# Patient Record
Sex: Male | Born: 1960 | Race: Black or African American | Hispanic: No | Marital: Single | Smoking: Current every day smoker
Health system: Southern US, Community
[De-identification: ages and names within clinical notes are randomized; demographics above are authoritative.]

## PROBLEM LIST (undated history)

## (undated) HISTORY — PX: HERNIA REPAIR: SHX51

## (undated) HISTORY — PX: HAND SURGERY: SHX662

---

## 2009-12-15 ENCOUNTER — Emergency Department (HOSPITAL_COMMUNITY): Admission: EM | Admit: 2009-12-15 | Discharge: 2009-12-15 | Payer: Self-pay | Admitting: Emergency Medicine

## 2015-05-24 ENCOUNTER — Emergency Department (HOSPITAL_COMMUNITY)
Admission: EM | Admit: 2015-05-24 | Discharge: 2015-05-24 | Disposition: A | Discharge status: Home / Self Care | Payer: Self-pay | Attending: Emergency Medicine | Admitting: Emergency Medicine

## 2015-05-24 ENCOUNTER — Encounter (HOSPITAL_COMMUNITY): Payer: Self-pay | Admitting: *Deleted

## 2015-05-24 DIAGNOSIS — K047 Periapical abscess without sinus: Secondary | ICD-10-CM

## 2015-05-24 DIAGNOSIS — Z720 Tobacco use: Secondary | ICD-10-CM | POA: Insufficient documentation

## 2015-05-24 DIAGNOSIS — K029 Dental caries, unspecified: Secondary | ICD-10-CM | POA: Insufficient documentation

## 2015-05-24 MED ORDER — PENICILLIN V POTASSIUM 500 MG PO TABS
500.0000 mg | ORAL_TABLET | Freq: Once | ORAL | Status: AC
  Administered 2015-05-24: 500 mg via ORAL
  Filled 2015-05-24: qty 1

## 2015-05-24 MED ORDER — NAPROXEN 500 MG PO TABS
500.0000 mg | ORAL_TABLET | Freq: Once | ORAL | Status: AC
  Administered 2015-05-24: 500 mg via ORAL
  Filled 2015-05-24: qty 1

## 2015-05-24 MED ORDER — NAPROXEN 250 MG PO TABS: 250.0000 mg | ORAL_TABLET | Freq: Two times a day (BID) | ORAL | Status: DC

## 2015-05-24 MED ORDER — PENICILLIN V POTASSIUM 500 MG PO TABS: 500.0000 mg | ORAL_TABLET | Freq: Four times a day (QID) | ORAL | Status: DC

## 2015-05-24 NOTE — Discharge Instructions (Signed)
Dental Abscess °A dental abscess is a collection of infected fluid (pus) from a bacterial infection in the inner part of the tooth (pulp). It usually occurs at the end of the tooth's root.  °CAUSES  °· Severe tooth decay. °· Trauma to the tooth that allows bacteria to enter into the pulp, such as a broken or chipped tooth. °SYMPTOMS  °· Severe pain in and around the infected tooth. °· Swelling and redness around the abscessed tooth or in the mouth or face. °· Tenderness. °· Pus drainage. °· Bad breath. °· Bitter taste in the mouth. °· Difficulty swallowing. °· Difficulty opening the mouth. °· Nausea. °· Vomiting. °· Chills. °· Swollen neck glands. °DIAGNOSIS  °· A medical and dental history will be taken. °· An examination will be performed by tapping on the abscessed tooth. °· X-rays may be taken of the tooth to identify the abscess. °TREATMENT °The goal of treatment is to eliminate the infection. You may be prescribed antibiotic medicine to stop the infection from spreading. A root canal may be performed to save the tooth. If the tooth cannot be saved, it may be pulled (extracted) and the abscess may be drained.  °HOME CARE INSTRUCTIONS °· Only take over-the-counter or prescription medicines for pain, fever, or discomfort as directed by your caregiver. °· Rinse your mouth (gargle) often with salt water (¼ tsp salt in 8 oz [250 ml] of warm water) to relieve pain or swelling. °· Do not drive after taking pain medicine (narcotics). °· Do not apply heat to the outside of your face. °· Return to your dentist for further treatment as directed. °SEEK MEDICAL CARE IF: °· Your pain is not helped by medicine. °· Your pain is getting worse instead of better. °SEEK IMMEDIATE MEDICAL CARE IF: °· You have a fever or persistent symptoms for more than 2-3 days. °· You have a fever and your symptoms suddenly get worse. °· You have chills or a very bad headache. °· You have problems breathing or swallowing. °· You have trouble  opening your mouth. °· You have swelling in the neck or around the eye. °Document Released: 10/14/2005 Document Revised: 07/08/2012 Document Reviewed: 01/22/2011 °ExitCare® Patient Information ©2015 [employer]. This information is not intended to replace advice given to you by your health care provider. Make sure you discuss any questions you have with your health care provider. ° ° °Emergency Department Resource Guide °1) Find a Doctor and Pay Out of Pocket °Although you won't have to find out who is covered by your insurance plan, it is a good idea to ask around and get recommendations. You will then need to call the office and see if the doctor you have chosen will accept you as a new patient and what types of options they offer for patients who are self-pay. Some doctors offer discounts or will set up payment plans for their patients who do not have insurance, but you will need to ask so you aren't surprised when you get to your appointment. ° °2) Contact Your Local Health Department °Not all health departments have doctors that can see patients for sick visits, but many do, so it is worth a call to see if yours does. If you don't know where your local health department is, you can check in your phone book. The CDC also has a tool to help you locate your state's health department, and many state websites also have listings of all of their local health departments. ° °3) Find a Walk-in   Clinic °If your illness is not likely to be very severe or complicated, you may want to try a walk in clinic. These are popping up all over the country in pharmacies, drugstores, and shopping centers. They're usually staffed by nurse practitioners or physician assistants that have been trained to treat common illnesses and complaints. They're usually fairly quick and inexpensive. However, if you have serious medical issues or chronic medical problems, these are probably not your best option. ° °No Primary Care Doctor: °- Call  [facility] at  832-8000 - they can help you locate a primary care doctor that  accepts your insurance, provides certain services, etc. °- Physician Referral Service- 1-800-533-3463 ° °Chronic Pain Problems: °Organization         Address  Phone   Notes  °[facility]  (336) 297-2271 Patients need to be referred by their primary care doctor.  ° °Medication Assistance: °Organization         Address  Phone   Notes  °[place] [facility] [address] °[place], [place] [address] (336) 641-8030 --Must be a resident of [place] °-- Must have NO insurance coverage whatsoever (no Medicaid/ Medicare, etc.) °-- The pt. MUST have a primary care doctor that directs their care regularly and follows them in the community °  °[facility]  (866) 331-1348   °[employer]  (888) 892-1162   ° °Agencies that provide inexpensive medical care: °Organization         Address  Phone   Notes  °[facility]  (336) 832-8035   °[facility]    (336) 832-7272   °[facility] [address] °[place], [place] [address] (336) 832-4777   °[facility] [address], °[place] (336) 271-4999   °[facility]    (336) 373-0678   °[facility]    (336) 272-1050   °[facility] ° [address], [place] Phone:  (336) 832-4444, Fax:  (336) 832-4440 Hours of Operation:  9 am - 6 pm, M-F.  Also accepts Medicaid/Medicare and self-pay.  °[facility] ° [address], [place] Phone: (336) 832-3150, Fax: (336) 832-3151. Hours of Operation:  8:30 am - 5:30 pm, M-F.  Also accepts Medicaid and self-pay.  °[facility] [place] [address], [place] Phone: (336) 878-6027   °[facility] [address], [place], [place] (336)723-1848, Ext. 123 Mondays & Thursdays: 7-9 AM.  First 15 patients are seen on a first come, first serve  basis. °  ° °Medicaid-accepting [place] Providers: ° °Organization         Address  Phone   Notes  °[facility] [address], [place] (336) 641-2100 Also accepts self-pay patients.  °[facility] [address], [place] ° (336) 856-9996   °[facility] [address], [place] (336) 288-8857   °[facility] [address] [place] [address], [place] (336) 299-7000   °Veita Bland [address], [place]  ° (336) 373-1557 Only accepts [place] Access Medicaid patients after they have their name applied to their card.  ° °Self-Pay (no insurance) in [place]: ° °Organization         Address  Phone   Notes  °Sickle Cell Patients, [facility] [address], [place] (  336) 832-1970   °[facility] [address], [place] (336) 832-4400   ° [facility] ° [address], [place] (336) 992-4800   °[facility]/Dr. Osei-Bonsu ° [address] [place] [address], [place] or [address], [place] (336) 841-8500 Phone number for both [place] and [place] locations is the same.  °[facility] [address], [place] (336) 299-0000   °[facility] [address] [place] [address], [place] or [address] (336) 852-7530 °(336) 878-2260   °[facility] [address], [place] (336) 350-1642, phone; (336) 294-5005, fax Sees patients 1st and 3rd Saturday of every month.  Must not qualify for public or private insurance (i.e. Medicaid, Medicare, [place] Health Choice, Veterans' Benefits) • Household income should be no more than 200% of the poverty level •The clinic cannot treat you if you are pregnant or think you are pregnant • Sexually transmitted diseases are not treated at the clinic.  ° ° °Dental Care: °Organization         Address  Phone  Notes  °[place] [address], [place] (336) 641-6152 Accepts children up to age 21 who are enrolled in Medicaid or [place] Health Choice; pregnant women with a Medicaid card; and children who have applied for Medicaid or [place] Health Choice, but were declined, whose parents can pay a reduced fee at time of service.  °[facility] [place]  [address], [place] (336) 641-7733 Accepts children up to age 21 who are enrolled in Medicaid or [place] Health Choice; pregnant women with a Medicaid card; and children who have applied for Medicaid or [place] Health Choice, but were declined, whose parents can pay a reduced fee at time of service.  °[facility] ° [address], [place] (336) 641-4533 Patients are seen by appointment only. Walk-ins are not accepted. [facility] will see patients 18 years of age and older. °Monday - Tuesday (8am-5pm) °Most Wednesdays (8:30-5pm) °$30 per visit, cash only  °[facility] ° [address], [place] (336) 641-4533 Patients are seen by appointment only. Walk-ins are not accepted. [facility] will see patients 18 years of age and older. °One Wednesday Evening (Monthly: Volunteer Based).  $30 per visit, cash only  °[facility]  (919) 537-3737 for adults; Children under age 4, call Graduate Pediatric Dentistry at ([phone]) [phone]. Children aged 4-14, please call (919) [phone] to request a pediatric application. ° Dental services are provided in all areas of dental care including fillings, crowns and bridges, complete and partial dentures, implants, gum treatment, root canals, and extractions. Preventive care is also provided. Treatment is provided to both adults and children. °Patients are selected via a lottery and there is often a waiting list. °  °[facility] [address], °[place] ° (336) 763-8833  [email or web address] °  °[facility] [address], [place], [place] (336)723-1848, Ext. 123 Second and Fourth Thursday of each month, opens at 6:30 AM; Clinic ends at 9 AM.  Patients are seen on a first-come first-served basis, and a limited number are seen during each clinic.  ° °[facility] ° [address], [place], [place] (336) 723-7904   Eligibility Requirements °You must have lived in [place], [place], or [place] counties   for at least the last three months. °  You cannot be eligible for state or federal sponsored healthcare insurance, including Veterans Administration, Medicaid, or Medicare. °  You generally cannot be eligible for healthcare insurance through your employer.  °  How to apply: °Eligibility screenings are held every Tuesday and Wednesday afternoon from 1:00 pm until 4:00 pm. You do not need an appointment for the interview!  °[facility] [address], [place], [place] 336-631-2330   °[facility]  336-342-8273   °[facility]  336-703-3100   °[facility]  336-570-6415   ° °Behavioral Health Resources in the Community: °Intensive Outpatient Programs °Organization         Address  Phone  Notes  °[facility] [address], [place], [place] 336-878-6098   °[facility] [address], [place], [place] 336-832-9800   °[facility] [address], [place], [place] ° 336-882-2125   °[facility] [address],  °[place], [place] 1-800-853-5163 or 336-641-4981   °Substance Abuse Resources °Organization         Address  Phone  Notes  °[facility]  336-882-2125   °[facility]  336-784-9470   °[facility]  336-285-9073   °[facility]  336-845-3988   °Residential & Outpatient Substance Abuse Program  1-800-659-3381   °Psychological Services °Organization          Address  Phone  Notes  °[facility]  336- 832-9600   °[facility]  336- 378-7881   °[facility] [address], [place] 1-800-853-5163 or 336-641-4981   ° °Mobile Crisis Teams °Organization         Address  Phone  Notes  °[facility], Mobile Crisis Care Unit  1-877-626-1772   °Assertive °[facility] ° [address] [place], [place] 336-834-9664   °Sharon DeEsch [address] °[place] 336-554-5454   ° °Self-Help/Support Groups °Organization         Address  Phone             Notes  °[facility] - variety of support groups  336- 373-1402 Call for more information  °Narcotics Anonymous (NA), [facility] [address], °[place]  2 meetings at this location  ° °Residential Treatment Programs °Organization         Address  Phone  Notes  °[facility] [address],    °[place]  1-866-801-8205   °[facility] ° [address], [place], [place] 704-293-8524   °[facility] [address], [place] 336-845-3988 Admissions: 8am-3pm M-F  °[facility] [address]    °[place], [place] 336-841-1104   °[facility] [address], [place], [place] 336-379-7146   °[facility] [address]  °[place], [place] 336-285-9073   °[facility] Intensive Outpatient [address], [place], [place] 336-852-3033   °[facility] [address]  °[place], [place] 1-877-615-2722 or 336-784-9470   °[facility] [address] [place], [place] 336-227-7417 Accepts Medicaid  °[facility] [address]  °  1-800-659-3381 Substance Abuse/Addiction Treatment  ° °[place] Behavioral Health Resources °Organization         Address  Phone  Notes  °[facility]  (888)   581-9988   °Julie Brannon, PhD [address] [place], [place]   (336) 349-5553 or (336) 951-0000   °[facility]   [address] °[place], [place] (336) 349-4454   °[facility] [address], [place], [place] (336) 342-8316 Insurance/Medicaid/sponsorship through [facility]  °[facility] [address]                                    [place], [place] (336) 342-8316 Therapy/tele-psych/case  °[facility] [address]  ° [place], [place] (336) 349-2233    °Dr. Arfeen  (336) 349-4544   °[facility] [place]  [employer] [place] [facility] 1) [address], [place] °2) [address], [place] °3)  [address], [place] (336) 349-3220 °(336) 342-7768 ° °(336) 342-8140   °[place] [facility] (336) 342-1394 or (336) 342-3537 (After Hours)    ° ° ° °

## 2015-05-24 NOTE — ED Provider Notes (Signed)
History  This chart was scribed for non-physician practitioner, Everlene Farrier, PA-C,working with Arby Barrette, MD, by Karle Plumber, ED Scribe. This patient was seen in room WTR8/WTR8 and the patient's care was started at 12:11 PM.  Chief Complaint  Patient presents with  . Dental Pain   The history is provided by the patient and medical records. No language interpreter was used.    HPI Comments:  Jeffrey Sheppard is a 54 y.o. male who presents to the Emergency Department complaining of severe lower right-sided dental pain that began three days ago. He states he believes he has an abscess because he has had them before in the same area. He reports associated right-sided facial swelling. He rates the pain as 10/10. Pt reports he has not taken anything for pain. He reports he has been eating and drinking normally. He denies any modifying factors. He denies fever, chills, nausea, vomiting, difficulty swallowing, drooling, inability to swallow, ear pain, neck pain or drainage from the area. He denies h/o IV drug use. He has not seen and does not have a dentist.   History reviewed. No pertinent past medical history. History reviewed. No pertinent past surgical history. No family history on file. History  Substance Use Topics  . Smoking status: Current Every Day Smoker  . Smokeless tobacco: Not on file  . Alcohol Use: No    Review of Systems  Constitutional: Negative for fever and chills.  HENT: Positive for dental problem and facial swelling. Negative for drooling, ear pain and trouble swallowing.   Gastrointestinal: Negative for nausea, vomiting and abdominal pain.  Musculoskeletal: Negative for neck pain and neck stiffness.  Skin: Negative for rash.  Neurological: Negative for light-headedness and headaches.    Allergies  Review of patient's allergies indicates not on file.  Home Medications   Prior to Admission medications   Medication Sig Start Date End Date Taking?  Authorizing Provider  naproxen (NAPROSYN) 250 MG tablet Take 1 tablet (250 mg total) by mouth 2 (two) times daily with a meal. 05/24/15   Everlene Farrier, PA-C  penicillin v potassium (VEETID) 500 MG tablet Take 1 tablet (500 mg total) by mouth 4 (four) times daily. 05/24/15   Everlene Farrier, PA-C   Triage Vitals: BP 153/84 mmHg  Pulse 69  Temp(Src) 97.7 F (36.5 C) (Oral)  Resp 18  SpO2 100% Physical Exam  Constitutional: He appears well-developed and well-nourished. No distress.  Non-toxic appearing.   HENT:  Head: Normocephalic and atraumatic.  Right Ear: External ear normal.  Left Ear: External ear normal.  Mouth/Throat: Oropharynx is clear and moist. Abnormal dentition. Dental abscesses and dental caries present. No oropharyngeal exudate.  Tenderness to right lower jaw. No discharge from the mouth. Right-sided facial swelling.  Uvula is midline without edema. Soft palate rises symmetrically. No tonsillar hypertrophy or exudates. Tongue protrusion is normal.  Area of induration to right lower jaw. Generally poor dentition. No area of fluctuance. Partially edentulous  Eyes: Conjunctivae are normal. Pupils are equal, round, and reactive to light. Right eye exhibits no discharge. Left eye exhibits no discharge.  Neck: Normal range of motion. Neck supple. No JVD present. No tracheal deviation present.  Cardiovascular: Normal rate and intact distal pulses.   Pulmonary/Chest: Effort normal. No respiratory distress.  Lymphadenopathy:    He has no cervical adenopathy.  Neurological: Coordination normal.  Skin: No rash noted. He is not diaphoretic.  Psychiatric: He has a normal mood and affect. His behavior is normal.  Nursing note and  vitals reviewed.   ED Course  Procedures (including critical care time) DIAGNOSTIC STUDIES: Oxygen Saturation is 100% on RA, normal by my interpretation.   COORDINATION OF CARE: 12:16 PM- Will prescribe antibiotics and refer to dentist. Spoke with Dr.  Donnald Garre about patient. Pt verbalizes understanding and agrees to plan.  Medications  penicillin v potassium (VEETID) tablet 500 mg (500 mg Oral Given 05/24/15 1226)  naproxen (NAPROSYN) tablet 500 mg (500 mg Oral Given 05/24/15 1226)    Labs Review Labs Reviewed - No data to display  Imaging Review No results found.   EKG Interpretation None      Filed Vitals:   05/24/15 1136  BP: 153/84  Pulse: 69  Temp: 97.7 F (36.5 C)  TempSrc: Oral  Resp: 18  SpO2: 100%     MDM   Meds given in ED:  Medications  penicillin v potassium (VEETID) tablet 500 mg (500 mg Oral Given 05/24/15 1226)  naproxen (NAPROSYN) tablet 500 mg (500 mg Oral Given 05/24/15 1226)    Discharge Medication List as of 05/24/2015 12:23 PM    START taking these medications   Details  naproxen (NAPROSYN) 250 MG tablet Take 1 tablet (250 mg total) by mouth 2 (two) times daily with a meal., Starting 05/24/2015, Until Discontinued, Print    penicillin v potassium (VEETID) 500 MG tablet Take 1 tablet (500 mg total) by mouth 4 (four) times daily., Starting 05/24/2015, Until Discontinued, Print        Final diagnoses:  Dental abscess   This is a 54 y.o. male who presents to the Emergency Department complaining of severe lower right-sided dental pain that began three days ago. He states he believes he has an abscess because he has had them before in the same area. He reports associated right-sided facial swelling. On exam patient is afebrile and nontoxic appearing. The patient has area of induration to his right lower jaw. There is no area of fluctuance appreciated. There seems to be no abscessed drained with an induration. Patient has generally poor dentition.No discharge from his mouth. Tongue protrusion is normal.Patient reports he has been able to eat inject normally. Exam unconcerning for Ludwig's angina or spread of infection. We'll treat patient with penicillin and naproxen and have a follow-up with his dentist.  Patient tolerated his first dose of penicillin and naproxen in the ED without difficulty. I advised the patient to follow-up with their primary care provider this week. I advised the patient to return to the emergency department with new or worsening symptoms or new concerns. The patient verbalized understanding and agreement with plan.    This patient was discussed with Dr. Donnald Garre who agrees with assessment and plan.   I personally performed the services described in this documentation, which was scribed in my presence. The recorded information has been reviewed and is accurate.    Everlene Farrier, PA-C 05/24/15 1246  Arby Barrette, MD 06/05/15 [phone]

## 2015-05-24 NOTE — ED Notes (Signed)
Pt complains of pain and swelling for the past 3 days in his right in his lower jaw. Pt states he has had an abscess in this area before, which went away with antibiotics.

## 2019-06-09 ENCOUNTER — Other Ambulatory Visit: Payer: Self-pay

## 2019-06-09 ENCOUNTER — Ambulatory Visit (INDEPENDENT_AMBULATORY_CARE_PROVIDER_SITE_OTHER): Payer: BLUE CROSS/BLUE SHIELD

## 2019-06-09 ENCOUNTER — Ambulatory Visit (HOSPITAL_COMMUNITY)
Admission: EM | Admit: 2019-06-09 | Discharge: 2019-06-09 | Disposition: A | Discharge status: Home / Self Care | Payer: BLUE CROSS/BLUE SHIELD | Attending: Urgent Care | Admitting: Urgent Care

## 2019-06-09 ENCOUNTER — Encounter (HOSPITAL_COMMUNITY): Payer: Self-pay | Admitting: Emergency Medicine

## 2019-06-09 DIAGNOSIS — M25512 Pain in left shoulder: Secondary | ICD-10-CM

## 2019-06-09 DIAGNOSIS — W2209XA Striking against other stationary object, initial encounter: Secondary | ICD-10-CM

## 2019-06-09 DIAGNOSIS — M25522 Pain in left elbow: Secondary | ICD-10-CM

## 2019-06-09 DIAGNOSIS — S59902A Unspecified injury of left elbow, initial encounter: Secondary | ICD-10-CM

## 2019-06-09 MED ORDER — PREDNISONE 20 MG PO TABS: ORAL_TABLET | ORAL | 0 refills | Status: DC

## 2019-06-09 NOTE — ED Triage Notes (Signed)
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  Patient was tugging at a bolt, when it released, left elbow forcibly connected with metal piece.  This occurred 4 months ago.  Now elbow hurts and pain has radiated to shoulder and across top of shoulder

## 2019-06-09 NOTE — ED Provider Notes (Signed)
  MRN: [id number] DOB: 11-Jul-1961  Subjective:   Jeffrey Sheppard is a 58 y.o. male presenting for 3 to 71-month history of persistent left elbow pain with associated swelling.  Patient states that he was working to try and removable top of a car when he lost grip and jammed his elbow into a metal plate behind him.  He has since had daily intermittent sharp pains worse with pressure or full flexion.  He is now having the pain radiating into his left upper arm and shoulder. Smokes 1ppd.   Denies taking chronic medications, has no known food or drug allergies.  Denies past medical and past surgical history.  ROS Denies fever, redness, warmth, bruising, loss of sensation and range of motion for his elbow and shoulder.  Objective:   Vitals: BP 125/86 (BP Location: Right Arm)   Pulse 75   Temp 97.8 F (36.6 C) (Oral)   Resp 20   SpO2 96%   Physical Exam Constitutional:      Appearance: Normal appearance. He is well-developed and normal weight.  HENT:     Head: Normocephalic and atraumatic.     Right Ear: External ear normal.     Left Ear: External ear normal.     Nose: Nose normal.     Mouth/Throat:     Pharynx: Oropharynx is clear.  Eyes:     Extraocular Movements: Extraocular movements intact.     Pupils: Pupils are equal, round, and reactive to light.  Cardiovascular:     Rate and Rhythm: Normal rate.  Pulmonary:     Effort: Pulmonary effort is normal.  Musculoskeletal:     Left elbow: He exhibits swelling (Trace over olecranon process). He exhibits normal range of motion, no deformity and no laceration. Tenderness found. Olecranon process (With associated slight crepitus) tenderness noted.  Neurological:     Mental Status: He is alert and oriented to person, place, and time.  Psychiatric:        Mood and Affect: Mood normal.        Behavior: Behavior normal.    Dg Elbow Complete Left  Result Date: 06/09/2019 CLINICAL DATA:  Left elbow pain x 2 months due to wrench slipping  and hit his elbow on iron bar. Hurts to put pressure on it and and cold weather. No previous injury or fx. Per provider: Left elbow injury, crepitusleft elbow injury, crepitus EXAM: LEFT ELBOW - COMPLETE 3+ VIEW COMPARISON:  None. FINDINGS: There is no evidence of fracture, dislocation, or joint effusion. There is no evidence of arthropathy or other focal bone abnormality. Soft tissues are unremarkable. IMPRESSION: No fracture or dislocation. Electronically Signed   By: Suzy Bouchard M.D.   On: 06/09/2019 18:14    Assessment and Plan :   1. Elbow injury, left, initial encounter   2. Left elbow pain   3. Acute pain of left shoulder     There appears to be a loose body on x-ray over lateral side of his left elbow seen on lateral and AP views.  Overread is negative.  Will use prednisone course given that patient has tried arthritis medication to help with his pain and has not gotten relief.  Recommended patient rest from work.  Note provided. Counseled patient on potential for adverse effects with medications prescribed/recommended today, ER and return-to-clinic precautions discussed, patient verbalized understanding.     Jaynee Eagles, [place] 06/09/19 1824

## 2020-04-06 IMAGING — DX LEFT ELBOW - COMPLETE 3+ VIEW
4 series · 4 of 4 positions shown · non-contrast
Comparison: None.

CLINICAL DATA: Left elbow pain x 2 months due to wrench slipping
and hit his elbow on iron bar. Hurts to put pressure on it and and
cold weather. No previous injury or fx. Per provider: Left elbow
injury, crepitusleft elbow injury, crepitus

EXAM:
LEFT ELBOW - COMPLETE 3+ VIEW

[elbow ap]
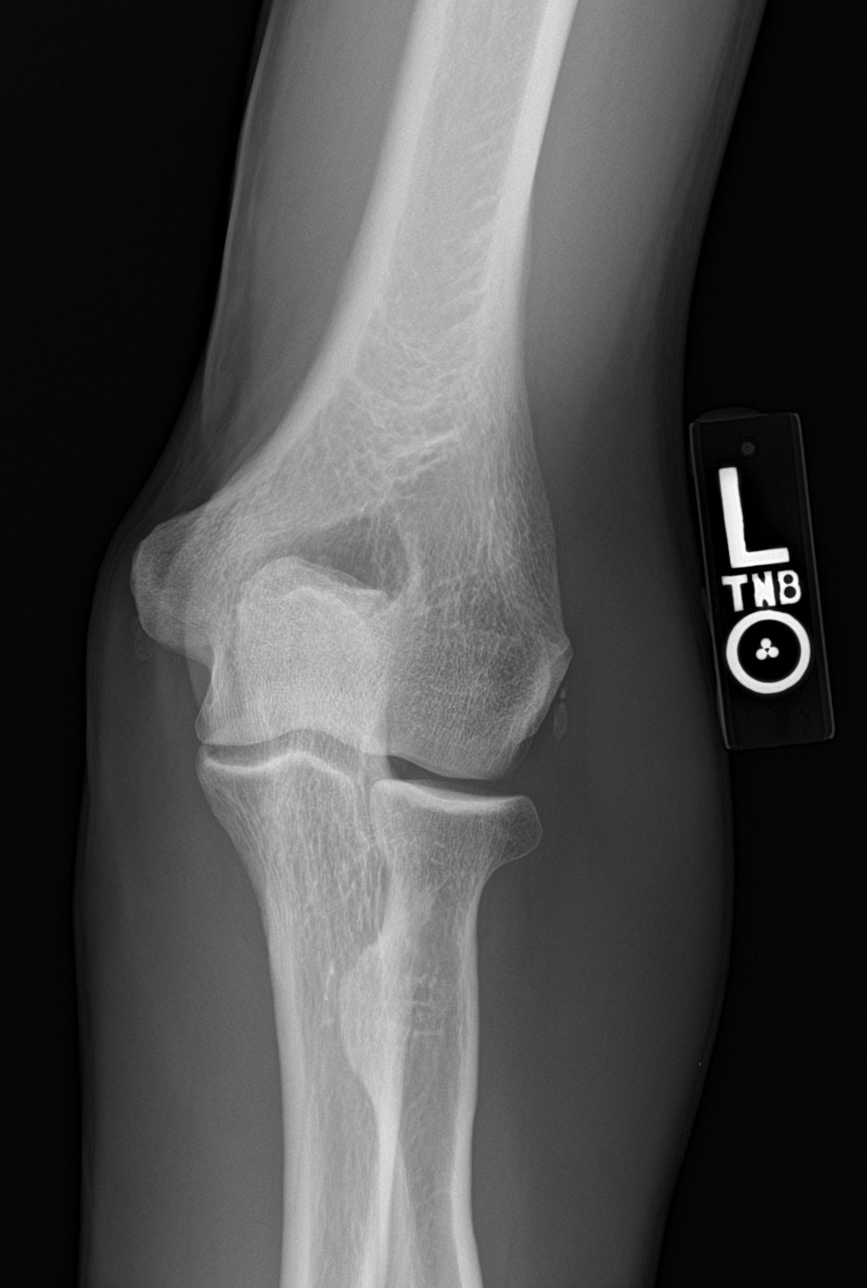

[elbow obl (1 of 2)]
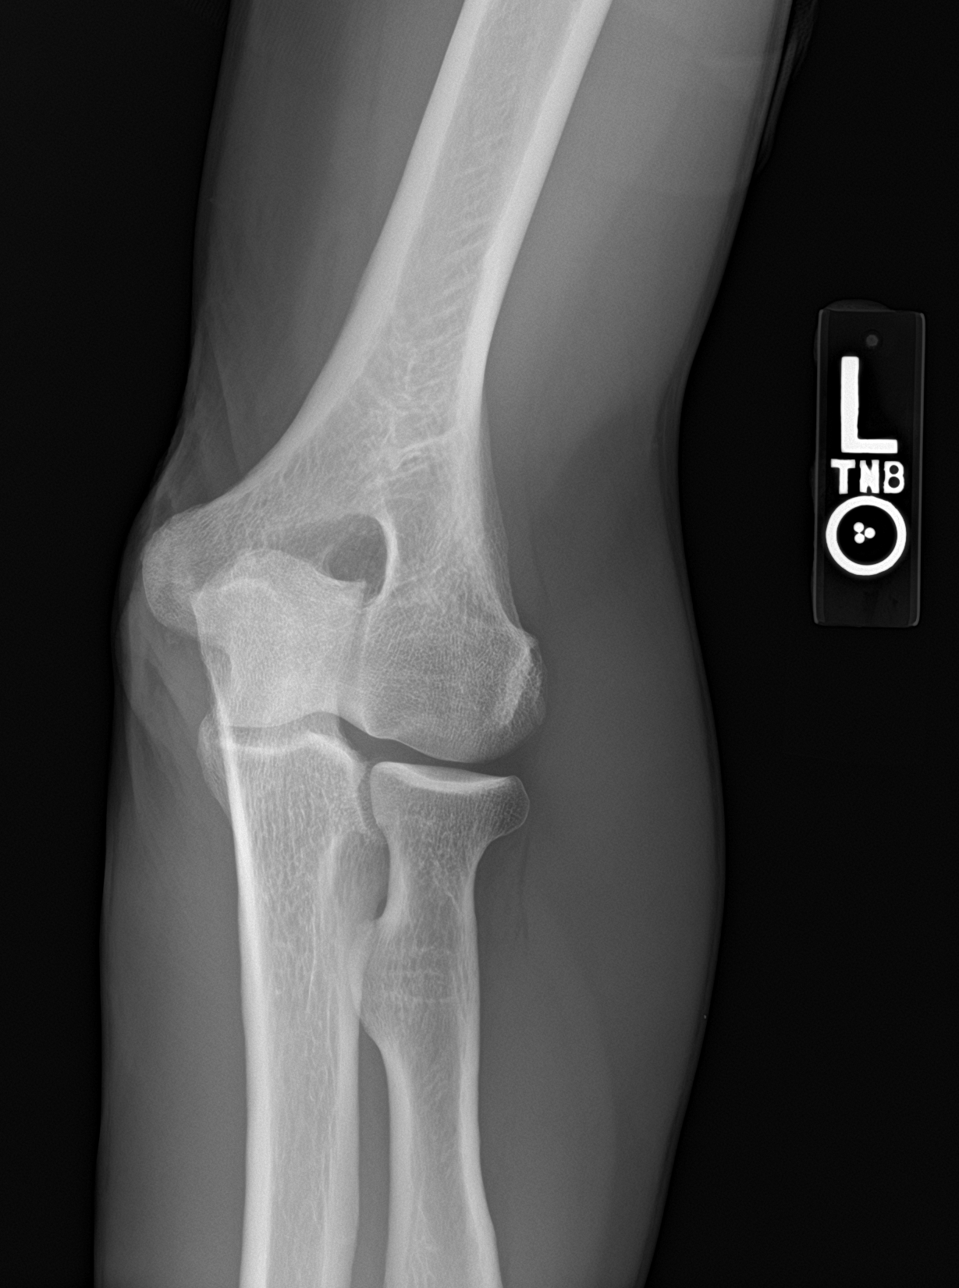

[elbow obl (2 of 2)]
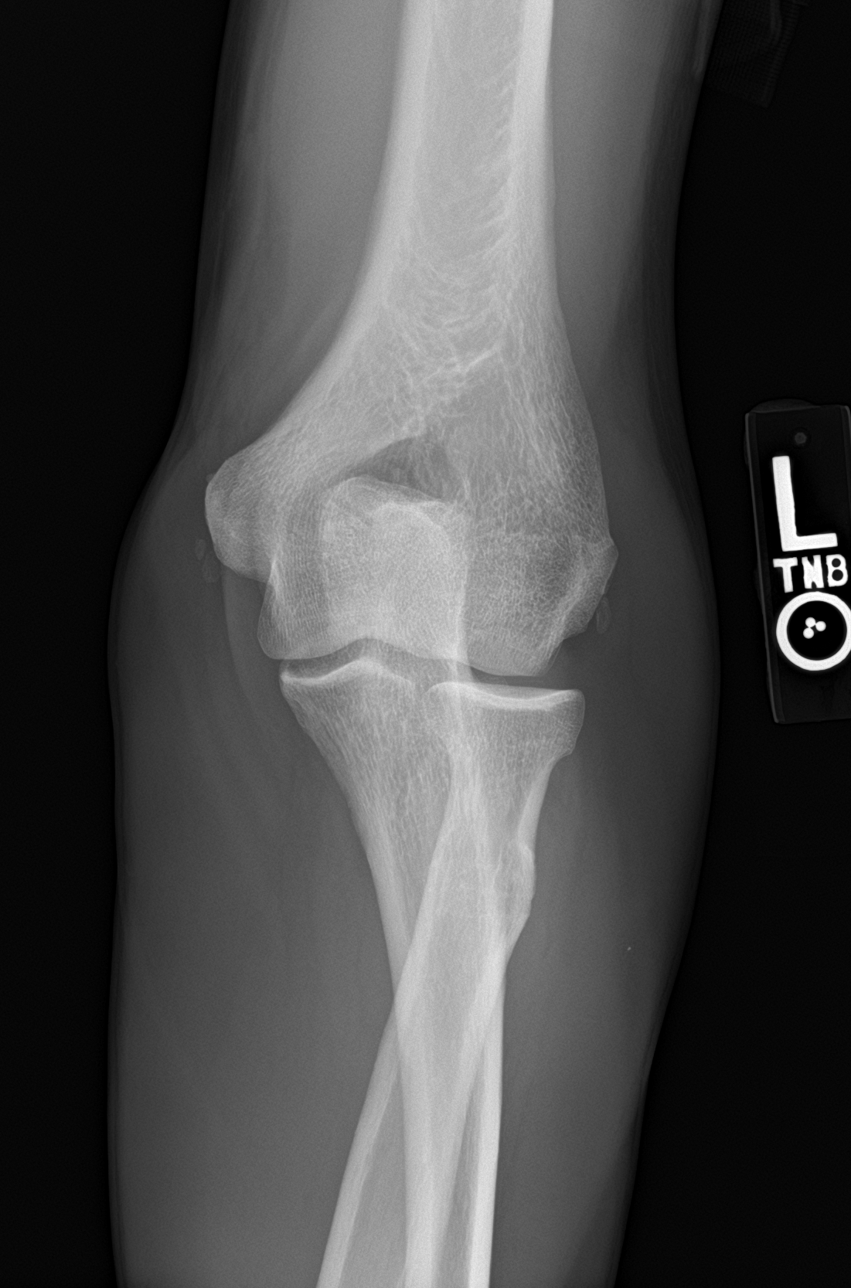

[elbow lat]
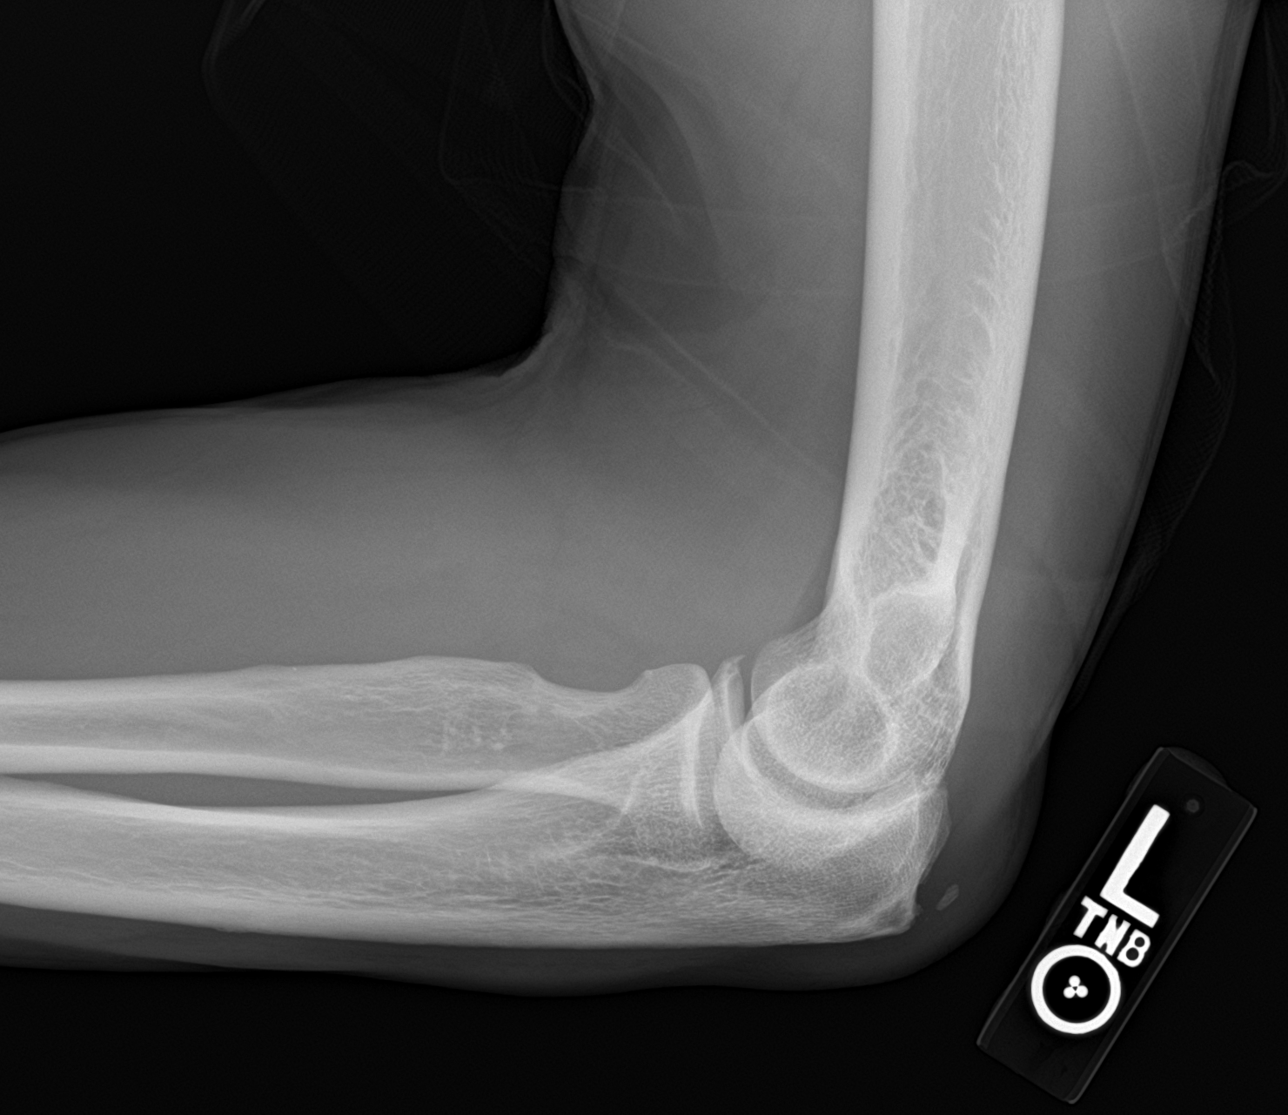

[4 of 4 positions shown; findings below may reference images not displayed]

FINDINGS: There is no evidence of fracture, dislocation, or joint effusion.
There is no evidence of arthropathy or other focal bone abnormality.
Soft tissues are unremarkable.
IMPRESSION: No fracture or dislocation.

## 2024-10-07 ENCOUNTER — Ambulatory Visit: Admitting: Orthopaedic Surgery

## 2024-10-07 ENCOUNTER — Encounter: Payer: Self-pay | Admitting: Orthopaedic Surgery

## 2024-10-07 ENCOUNTER — Other Ambulatory Visit: Payer: Self-pay

## 2024-10-07 DIAGNOSIS — M25512 Pain in left shoulder: Secondary | ICD-10-CM

## 2024-10-07 DIAGNOSIS — G8929 Other chronic pain: Secondary | ICD-10-CM

## 2024-10-07 MED ORDER — CELECOXIB 200 MG PO CAPS: 200.0000 mg | ORAL_CAPSULE | Freq: Two times a day (BID) | ORAL | 1 refills | Status: AC | PRN

## 2024-10-07 MED ORDER — METHOCARBAMOL 500 MG PO TABS: 500.0000 mg | ORAL_TABLET | Freq: Three times a day (TID) | ORAL | 1 refills | Status: AC | PRN

## 2024-10-07 NOTE — Progress Notes (Signed)
 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  However he does report decreased motion of his shoulder and decreased strength and it has been worsening with time.  He feels like he does have significant arthritis in his right shoulder.  He denies any active medical problems at all.  He has not been to any other physician and denies having a PCP.  He is a chronic smoker.  On exam his left shoulder has significant limitations in range of motion with decreased external rotation as well as abduction and forward flexion.  There is grinding at the glenohumeral joint and some weakness of the rotator cuff with the pain.  3 views left shoulder show profound end-stage glenohumeral arthritis.  The humeral head is not high riding but it is bone-on-bone wear of the glenohumeral joint.  The patient's family was with him as well today.  He they did take pictures of his x-rays and understand that he has severe arthritis of his left shoulder.  I will send in some Celebrex as an anti-inflammatory and occasional Robaxin as a muscle relaxant.  I would like to send him to my partner Dr. Addie to consider further evaluation of the left shoulder and discuss further treatment options such as shoulder replacement surgery if warranted.  The patient agrees with this referral as well.

## 2024-10-22 ENCOUNTER — Ambulatory Visit (INDEPENDENT_AMBULATORY_CARE_PROVIDER_SITE_OTHER): Admitting: Family

## 2024-10-22 ENCOUNTER — Encounter: Payer: Self-pay | Admitting: Family

## 2024-10-22 VITALS — BP 138/78 | HR 78 | Temp 97.3°F | Ht 65.75 in | Wt 173.2 lb

## 2024-10-22 DIAGNOSIS — Z720 Tobacco use: Secondary | ICD-10-CM | POA: Diagnosis not present

## 2024-10-22 DIAGNOSIS — Z1159 Encounter for screening for other viral diseases: Secondary | ICD-10-CM | POA: Diagnosis not present

## 2024-10-22 DIAGNOSIS — Z1211 Encounter for screening for malignant neoplasm of colon: Secondary | ICD-10-CM

## 2024-10-22 DIAGNOSIS — M25512 Pain in left shoulder: Secondary | ICD-10-CM | POA: Diagnosis not present

## 2024-10-22 DIAGNOSIS — Z2821 Immunization not carried out because of patient refusal: Secondary | ICD-10-CM

## 2024-10-22 DIAGNOSIS — L84 Corns and callosities: Secondary | ICD-10-CM

## 2024-10-22 DIAGNOSIS — Z7689 Persons encountering health services in other specified circumstances: Secondary | ICD-10-CM | POA: Diagnosis not present

## 2024-10-22 DIAGNOSIS — K219 Gastro-esophageal reflux disease without esophagitis: Secondary | ICD-10-CM | POA: Diagnosis not present

## 2024-10-22 DIAGNOSIS — Z113 Encounter for screening for infections with a predominantly sexual mode of transmission: Secondary | ICD-10-CM

## 2024-10-22 DIAGNOSIS — Z13220 Encounter for screening for lipoid disorders: Secondary | ICD-10-CM | POA: Diagnosis not present

## 2024-10-22 DIAGNOSIS — G8929 Other chronic pain: Secondary | ICD-10-CM

## 2024-10-22 DIAGNOSIS — N529 Male erectile dysfunction, unspecified: Secondary | ICD-10-CM

## 2024-10-22 MED ORDER — PANTOPRAZOLE SODIUM 40 MG PO TBEC: 40.0000 mg | DELAYED_RELEASE_TABLET | Freq: Every day | ORAL | 3 refills | Status: AC

## 2024-10-22 MED ORDER — BUPROPION HCL ER (XL) 150 MG PO TB24: 150.0000 mg | ORAL_TABLET | Freq: Every day | ORAL | 3 refills | Status: AC

## 2024-10-22 MED ORDER — SILDENAFIL CITRATE 100 MG PO TABS: 50.0000 mg | ORAL_TABLET | Freq: Every day | ORAL | 11 refills | Status: AC | PRN

## 2024-10-22 NOTE — Progress Notes (Signed)
 "  Provider: Voncile Schwarz FNP-C   Jeffrey Sheppard, Jeffrey BROCKS, NP  Patient Care Team: Ataya Murdy, Jeffrey BROCKS, NP as PCP - General (Family Medicine)  Extended Emergency Contact Information Primary Emergency Contact: Sheppard,Jeffrey Address: [address]          [place], [place] [address] [place] Home Phone: [phone] Relation: Friend  Code Status:  Full Code Goals of care: Advanced Directive information    05/24/2015   11:39 AM  Advanced Directives  Does Patient Have a Medical Advance Directive? No   Would patient like information on creating a medical advance directive? No - patient declined information      Data saved with a previous flowsheet row definition     Chief Complaint  Patient presents with   Establish Care    Patient presents today to establish care. Care gaps have been discussed and addressed. No records found in [place]. Patient does not take vaccines. Also needs info for DNR, Living and HCPOA.     HPI:  Pt is a 63 y.o. male seen today to establish care for medical management of chronic diseases.He is here with his Nephew who provides additional HPI.Has not been seen by PCP for several years.  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 Pain worst with raising hand and trying to reach to the back.He denies any hand weakness,numbness,Tingling or drop items. Currently on Celebrex  and Robaxin  for pain.   States was told need a PCP.   He denies any chronic issues. Also complains of worsening acid reflux.Takes baking soda with some relief.No blood in the stool or dark colored stool. He smokes one pack of cigarettes per day for over 50 yrs. Drinks 4 glasses of wine per week and some beers at times. He does not use any illegal drugs.  Has had no recent hospitalization or Falls.   History reviewed. No pertinent past medical history. Past Surgical History:  Procedure  Laterality Date   HAND SURGERY Right    20 yeatrs ago per patient.   HERNIA REPAIR      Allergies[1]  Allergies as of 10/22/2024   No Known Allergies      Medication List        Accurate as of October 22, 2024  9:17 AM. If you have any questions, ask your nurse or doctor.          celecoxib  200 MG capsule Commonly known as: CELEBREX  Take 1 capsule (200 mg total) by mouth 2 (two) times daily between meals as needed.   methocarbamol  500 MG tablet Commonly known as: ROBAXIN  Take 1 tablet (500 mg total) by mouth every 8 (eight) hours as needed.   predniSONE  20 MG tablet Commonly known as: DELTASONE  Take 2 tablets daily with breakfast.        Review of Systems  Constitutional:  Negative for appetite change, chills, fatigue, fever and unexpected weight change.  HENT:  Positive for dental problem. Negative for congestion, ear discharge, ear pain, facial swelling, hearing loss, nosebleeds, postnasal drip, rhinorrhea, sinus pressure, sinus pain, sneezing, sore throat, tinnitus and trouble swallowing.        Missing all teeth   Eyes:  Negative for pain, discharge, redness, itching and visual disturbance.  Respiratory:  Negative for cough, chest tightness, shortness of breath and wheezing.   Cardiovascular:  Negative for chest pain, palpitations and leg swelling.  Gastrointestinal:  Negative for abdominal  distention, abdominal pain, blood in stool, constipation, diarrhea, nausea and vomiting.       Acid reflux   Endocrine: Negative for cold intolerance, heat intolerance, polydipsia, polyphagia and polyuria.  Genitourinary:  Negative for difficulty urinating, dysuria, flank pain, frequency and urgency.       Erectile dysfunction   Musculoskeletal:  Positive for arthralgias. Negative for back pain, gait problem, joint swelling, myalgias, neck pain and neck stiffness.       Left shoulder pain   Skin:  Negative for color change, pallor, rash and wound.  Neurological:  Negative  for dizziness, syncope, speech difficulty, weakness, light-headedness, numbness and headaches.  Hematological:  Does not bruise/bleed easily.  Psychiatric/Behavioral:  Negative for agitation, behavioral problems, confusion, hallucinations, self-injury, sleep disturbance and suicidal ideas. The patient is not nervous/anxious.      There is no immunization history on file for this patient. Pertinent  Health Maintenance Due  Topic Date Due   Colonoscopy  Never done   Influenza Vaccine  01/25/2025 (Originally 05/28/2024)      06/09/2019    5:11 PM 10/22/2024    9:06 AM  Fall Risk  Falls in the past year?  0  Was there an injury with Fall?  0  Fall Risk Category Calculator  0  (RETIRED) Patient Fall Risk Level Low fall risk    Patient at Risk for Falls Due to  No Fall Risks  Fall risk Follow up  Falls evaluation completed     Data saved with a previous flowsheet row definition   Functional Status Survey:    Vitals:   10/22/24 0910  BP: 138/78  Pulse: 78  Temp: (!) 97.3 F (36.3 C)  SpO2: 97%  Weight: 173 lb 3.2 oz (78.6 kg)  Height: 5' 5.75 (1.67 m)   Body mass index is 28.17 kg/m. Physical Exam Vitals reviewed.  Constitutional:      General: He is not in acute distress.    Appearance: Normal appearance. He is overweight. He is not ill-appearing or diaphoretic.  HENT:     Head: Normocephalic.     Right Ear: Tympanic membrane, ear canal and external ear normal. There is no impacted cerumen.     Left Ear: Tympanic membrane, ear canal and external ear normal. There is no impacted cerumen.     Nose: Nose normal. No congestion or rhinorrhea.     Mouth/Throat:     Mouth: Mucous membranes are moist.     Pharynx: Oropharynx is clear. No oropharyngeal exudate or posterior oropharyngeal erythema.  Eyes:     General: No scleral icterus.       Right eye: No discharge.        Left eye: No discharge.     Extraocular Movements: Extraocular movements intact.      Conjunctiva/sclera: Conjunctivae normal.     Pupils: Pupils are equal, round, and reactive to light.  Neck:     Vascular: No carotid bruit.  Cardiovascular:     Rate and Rhythm: Normal rate and regular rhythm.     Pulses: Normal pulses.     Heart sounds: Normal heart sounds. No murmur heard.    No friction rub. No gallop.  Pulmonary:     Effort: Pulmonary effort is normal. No respiratory distress.     Breath sounds: Normal breath sounds. No wheezing, rhonchi or rales.  Chest:     Chest wall: No tenderness.  Abdominal:     General: Bowel sounds are normal. There is no distension.  Palpations: Abdomen is soft. There is no mass.     Tenderness: There is no abdominal tenderness. There is no right CVA tenderness, left CVA tenderness, guarding or rebound.  Musculoskeletal:        General: No swelling or tenderness.     Right hand: No swelling or tenderness. Decreased range of motion.     Left hand: No swelling or tenderness. Decreased range of motion.     Cervical back: Normal range of motion. No rigidity or tenderness.     Right lower leg: No edema.     Left lower leg: No edema.     Right foot: Normal range of motion and normal capillary refill. Bunion present. No swelling, tenderness or crepitus. Normal pulse.     Left foot: Normal range of motion and normal capillary refill. Bunion present. No swelling, tenderness or crepitus. Normal pulse.     Comments: Arthritic changes to all fingers   Lymphadenopathy:     Cervical: No cervical adenopathy.  Skin:    General: Skin is warm and dry.     Coloration: Skin is not pale.     Findings: No bruising, erythema, lesion or rash.  Neurological:     Mental Status: He is alert and oriented to person, place, and time.     Cranial Nerves: No cranial nerve deficit.     Sensory: No sensory deficit.     Motor: No weakness.     Coordination: Coordination normal.     Gait: Gait normal.  Psychiatric:        Mood and Affect: Mood normal.         Speech: Speech normal.        Behavior: Behavior normal.        Thought Content: Thought content normal.        Judgment: Judgment normal.     Labs reviewed: No results for input(s): NA, K, CL, CO2, GLUCOSE, BUN, CREATININE, CALCIUM, MG, PHOS in the last 8760 hours. No results for input(s): AST, ALT, ALKPHOS, BILITOT, PROT, ALBUMIN in the last 8760 hours. No results for input(s): WBC, NEUTROABS, HGB, HCT, MCV, PLT in the last 8760 hours. No results found for: TSH No results found for: HGBA1C No results found for: CHOL, HDL, LDLCALC, LDLDIRECT, TRIG, CHOLHDL  Significant Diagnostic Results in last 30 days:  XR Shoulder Left Result Date: 10/07/2024 3 views of the left shoulder show end-stage glenohumeral arthritis.  The humeral head is not high riding but the glenohumeral joint is bone-on-bone arthritis.   Assessment/Plan 1. Gastroesophageal reflux disease without esophagitis (Primary) Worsening symptoms  No tarry or black stool  - advised to avoid eating meals late in the evening and to avoid aggravating foods and spices. - Start on Protonix  as below - pantoprazole  (PROTONIX ) 40 MG tablet; Take 1 tablet (40 mg total) by mouth daily.  Dispense: 30 tablet; Refill: 3 - COMPLETE METABOLIC PANEL WITHOUT GFR; Future - CBC with Differential/Platelet; Future - TSH; Future  2. Chronic left shoulder pain Limited range of motion due to pain Has upcoming appointment with [facility]  -Continue current pain regimen and follow-up with orthopedic as scheduled - Ambulatory referral to Gastroenterology  3. Screening for hyperlipidemia Dietary modification and exercise advised - Lipid panel; Future  4. Tobacco abuse disorder Smoking cessation advised willing to attempt cessation.  Alternative medication discussed prefers to start on Wellbutrin . - buPROPion  (WELLBUTRIN  XL) 150 MG 24 hr tablet; Take 1 tablet (150 mg total) by  mouth daily.  Dispense:  30 tablet; Refill: 3 - Follow-up in 1 month to reevaluate smoking cessation consider increasing Wellbutrin  to twice daily if not effective  5. Colon cancer screening Cologuard versus colonoscopy discussed patient prefers colonoscopy.  Will refer to gastroenterology.Made aware specialist office will call him to schedule appointment.  6. Screen for STD (sexually transmitted disease) Reports no high risk behaviors - HIV Antibody (routine testing w rflx); Future  7. Encounter for hepatitis C screening test for low risk patient Low risk - Hepatitis C antibody; Future  8. Erectile dysfunction, unspecified erectile dysfunction type Suspect due to smoking and alcohol use -Request Viagra  safety and side effects discussed - sildenafil  (VIAGRA ) 100 MG tablet; Take 0.5-1 tablets (50-100 mg total) by mouth daily as needed for erectile dysfunction.  Dispense: 5 tablet; Refill: 11  9. Callus of foot Dry hard skin noted on both feet.  - Ambulatory referral to Podiatry  10. Encounter to establish care Declined immunization  Family/ staff Communication: Reviewed plan of care with patient and nephew verbalized understanding  Labs/tests ordered:  - CBC with Differential/Platelet - CMP with eGFR([facility]) - TSH - Lipid panel - Hep C Antibody - HIV Antibody (routine testing w rflx)   Next Appointment : Return in about 1 month (around 11/22/2024) for Smoking Ceassation., fasting labs in one week.  Spent 45 minutes of Face to face and non-face to face with patient  >50% time spent counseling; reviewing medical record; tests; labs; documentation and developing future plan of care.   Jeffrey Sheppard Corie Vavra, NP       [1] No Known Allergies  "

## 2024-11-05 ENCOUNTER — Encounter: Payer: Self-pay | Admitting: Orthopedic Surgery

## 2024-11-05 ENCOUNTER — Ambulatory Visit: Admitting: Orthopedic Surgery

## 2024-11-05 ENCOUNTER — Other Ambulatory Visit: Payer: Self-pay

## 2024-11-05 DIAGNOSIS — M19012 Primary osteoarthritis, left shoulder: Secondary | ICD-10-CM | POA: Diagnosis not present

## 2024-11-05 DIAGNOSIS — M25512 Pain in left shoulder: Secondary | ICD-10-CM

## 2024-11-05 DIAGNOSIS — G8929 Other chronic pain: Secondary | ICD-10-CM

## 2024-11-05 MED ORDER — BUPIVACAINE HCL 0.5 % IJ SOLN
9.0000 mL | INTRAMUSCULAR | Status: AC | PRN
  Administered 2024-11-05: 9 mL via INTRA_ARTICULAR

## 2024-11-05 MED ORDER — LIDOCAINE HCL 1 % IJ SOLN
5.0000 mL | INTRAMUSCULAR | Status: AC | PRN
  Administered 2024-11-05: 5 mL

## 2024-11-05 MED ORDER — METHYLPREDNISOLONE ACETATE 40 MG/ML IJ SUSP
40.0000 mg | INTRAMUSCULAR | Status: AC | PRN
  Administered 2024-11-05: 40 mg via INTRA_ARTICULAR

## 2024-11-05 MED ORDER — METAXALONE 800 MG PO TABS: 800.0000 mg | ORAL_TABLET | Freq: Three times a day (TID) | ORAL | 0 refills | Status: AC

## 2024-11-05 NOTE — Progress Notes (Signed)
 "  Office Visit Note   Patient: Jeffrey Sheppard           Date of Birth: 1961-06-27           MRN: [id number] Visit Date: 11/05/2024 Requested by: No referring provider defined for this encounter. PCP: Ngetich, Roxan BROCKS, NP  Subjective: Chief Complaint  Patient presents with   Left Shoulder - Pain    HPI: Jeffrey Sheppard is a 64 y.o. male who presents to the office reporting left shoulder pain.  Patient has had chronic pain for years.  Reports decreased range of motion and pain.  He is right-hand dominant.  He works as a curator.  Muscle relaxers make him a little bit sleepy.  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  Has not had any prior surgery on that left shoulder..                ROS: All systems reviewed are negative as they relate to the chief complaint within the history of present illness.  Patient denies fevers or chills.  Assessment & Plan: Visit Diagnoses:  1. Chronic left shoulder pain     Plan: Impression is left shoulder end-stage arthritis with significant restriction of motion but good rotator cuff strength.  Plan is Skelaxin  as a muscle relaxer which is less sedating than Robaxin .  Ultrasound-guided glenohumeral joint injection performed.  We could repeat that injection 1 more time this year as needed.  I think he is likely heading for shoulder replacement at sometime in the future either reverse shoulder replacement or anatomic shoulder replacement.  I would lean a little bit more towards reverse shoulder replacement based on his diminished range of motion in terms of subscap excursion.  Follow-Up Instructions: No follow-ups on file.   Orders:  Orders Placed This Encounter  Procedures   US  Guided Needle Placement - No Linked Charges   No orders of the defined types were placed in this encounter.     Procedures: Large Joint Inj: L glenohumeral on 11/05/2024 7:25 PM Indications: diagnostic evaluation and pain Details: 22 G 3.5 in needle, ultrasound-guided  posterior approach  Arthrogram: No  Medications: 9 mL bupivacaine  0.5 %; 40 mg methylPREDNISolone  acetate 40 MG/ML; 5 mL lidocaine  1 % Outcome: tolerated well, no immediate complications Procedure, treatment alternatives, risks and benefits explained, specific risks discussed. Consent was given by the patient. Immediately prior to procedure a time out was called to verify the correct patient, procedure, equipment, support staff and site/side marked as required. Patient was prepped and draped in the usual sterile fashion.       Clinical Data: No additional findings.  Objective: Vital Signs: There were no vitals taken for this visit.  Physical Exam:  Constitutional: Patient appears well-developed HEENT:  Head: Normocephalic Eyes:EOM are normal Neck: Normal range of motion Cardiovascular: Normal rate Pulmonary/chest: Effort normal Neurologic: Patient is alert Skin: Skin is warm Psychiatric: Patient has normal mood and affect  Ortho Exam: Ortho exam demonstrates range of motion of 5/85/140.  Has good rotator cuff strength infraspinatus supraspinatus and subscap muscle testing.  Cervical spine range of motion intact.  Deltoid is functional.  Radial pulses intact.  Motor or sensory function of the hand is intact.  Specialty Comments:  No specialty comments available.  Imaging: No results found.   PMFS History: Patient Active Problem List   Diagnosis Date Noted   Gastroesophageal reflux disease without esophagitis 10/22/2024   Chronic left shoulder pain 10/22/2024  Tobacco abuse disorder 10/22/2024   Erectile dysfunction 10/22/2024   History reviewed. No pertinent past medical history.  History reviewed. No pertinent family history.  Past Surgical History:  Procedure Laterality Date   HAND SURGERY Right    20 yeatrs ago per patient.   HERNIA REPAIR     Social History   Occupational History   Not on file  Tobacco Use   Smoking status: Every Day   Smokeless  tobacco: Not on file  Vaping Use   Vaping status: Never Used  Substance and Sexual Activity   Alcohol use: Yes    Alcohol/week: 3.0 - 4.0 standard drinks of alcohol    Types: 3 - 4 Cans of beer per week   Drug use: Yes    Types: Marijuana   Sexual activity: Not on file        "

## 2024-11-10 ENCOUNTER — Ambulatory Visit (INDEPENDENT_AMBULATORY_CARE_PROVIDER_SITE_OTHER): Payer: Self-pay | Admitting: Podiatry

## 2024-11-10 DIAGNOSIS — Z91199 Patient's noncompliance with other medical treatment and regimen due to unspecified reason: Secondary | ICD-10-CM

## 2024-11-10 NOTE — Progress Notes (Signed)
 Did not come in or cancel appointment 24 hours as stated policy

## 2024-11-22 ENCOUNTER — Other Ambulatory Visit

## 2024-11-22 DIAGNOSIS — Z13220 Encounter for screening for lipoid disorders: Secondary | ICD-10-CM

## 2024-11-22 DIAGNOSIS — Z1159 Encounter for screening for other viral diseases: Secondary | ICD-10-CM

## 2024-11-22 DIAGNOSIS — Z113 Encounter for screening for infections with a predominantly sexual mode of transmission: Secondary | ICD-10-CM

## 2024-11-22 DIAGNOSIS — K219 Gastro-esophageal reflux disease without esophagitis: Secondary | ICD-10-CM

## 2024-11-24 ENCOUNTER — Ambulatory Visit: Admitting: Family

## 2024-12-23 ENCOUNTER — Ambulatory Visit: Admitting: Family
